# Patient Record
Sex: Female | Born: 1975 | Race: White | Hispanic: Yes | Marital: Single | Smoking: Never smoker
Health system: Southern US, Community
[De-identification: ages and names within clinical notes are randomized; demographics above are authoritative.]

## PROBLEM LIST (undated history)

## (undated) DIAGNOSIS — M545 Low back pain: Secondary | ICD-10-CM

## (undated) DIAGNOSIS — D649 Anemia, unspecified: Secondary | ICD-10-CM

## (undated) DIAGNOSIS — G8929 Other chronic pain: Secondary | ICD-10-CM

## (undated) DIAGNOSIS — N920 Excessive and frequent menstruation with regular cycle: Secondary | ICD-10-CM

## (undated) HISTORY — DX: Excessive and frequent menstruation with regular cycle: N92.0

## (undated) HISTORY — DX: Other chronic pain: G89.29

## (undated) HISTORY — DX: Anemia, unspecified: D64.9

## (undated) HISTORY — DX: Low back pain: M54.5

---

## 2000-01-20 ENCOUNTER — Ambulatory Visit (HOSPITAL_COMMUNITY): Admission: RE | Admit: 2000-01-20 | Discharge: 2000-01-20 | Payer: Self-pay | Admitting: *Deleted

## 2000-02-03 ENCOUNTER — Encounter: Admission: RE | Admit: 2000-02-03 | Discharge: 2000-02-03 | Payer: Self-pay | Admitting: Obstetrics

## 2000-02-16 ENCOUNTER — Encounter: Admission: RE | Admit: 2000-02-16 | Discharge: 2000-02-16 | Payer: Self-pay | Admitting: Obstetrics

## 2000-03-08 ENCOUNTER — Encounter: Admission: RE | Admit: 2000-03-08 | Discharge: 2000-03-08 | Payer: Self-pay | Admitting: Obstetrics & Gynecology

## 2000-03-13 ENCOUNTER — Ambulatory Visit (HOSPITAL_COMMUNITY): Admission: RE | Admit: 2000-03-13 | Discharge: 2000-03-13 | Payer: Self-pay | Admitting: Obstetrics

## 2000-03-22 ENCOUNTER — Encounter: Admission: RE | Admit: 2000-03-22 | Discharge: 2000-03-22 | Payer: Self-pay | Admitting: Obstetrics & Gynecology

## 2000-04-05 ENCOUNTER — Encounter: Admission: RE | Admit: 2000-04-05 | Discharge: 2000-04-05 | Payer: Self-pay | Admitting: Obstetrics & Gynecology

## 2000-04-10 ENCOUNTER — Ambulatory Visit (HOSPITAL_COMMUNITY): Admission: RE | Admit: 2000-04-10 | Discharge: 2000-04-10 | Payer: Self-pay | Admitting: Obstetrics

## 2000-04-12 ENCOUNTER — Encounter: Admission: RE | Admit: 2000-04-12 | Discharge: 2000-04-12 | Payer: Self-pay | Admitting: Obstetrics & Gynecology

## 2000-04-13 ENCOUNTER — Encounter: Admission: RE | Admit: 2000-04-13 | Discharge: 2000-04-13 | Payer: Self-pay | Admitting: Obstetrics

## 2000-04-19 ENCOUNTER — Encounter: Admission: RE | Admit: 2000-04-19 | Discharge: 2000-04-19 | Payer: Self-pay | Admitting: Obstetrics & Gynecology

## 2000-04-26 ENCOUNTER — Encounter: Admission: RE | Admit: 2000-04-26 | Discharge: 2000-04-26 | Payer: Self-pay | Admitting: Obstetrics & Gynecology

## 2000-05-03 ENCOUNTER — Encounter: Admission: RE | Admit: 2000-05-03 | Discharge: 2000-05-03 | Payer: Self-pay | Admitting: Obstetrics & Gynecology

## 2000-05-08 ENCOUNTER — Ambulatory Visit (HOSPITAL_COMMUNITY): Admission: RE | Admit: 2000-05-08 | Discharge: 2000-05-08 | Payer: Self-pay | Admitting: Obstetrics & Gynecology

## 2000-05-10 ENCOUNTER — Encounter: Admission: RE | Admit: 2000-05-10 | Discharge: 2000-05-10 | Payer: Self-pay | Admitting: Obstetrics & Gynecology

## 2000-05-17 ENCOUNTER — Encounter: Admission: RE | Admit: 2000-05-17 | Discharge: 2000-05-17 | Payer: Self-pay | Admitting: Obstetrics & Gynecology

## 2000-05-24 ENCOUNTER — Encounter: Admission: RE | Admit: 2000-05-24 | Discharge: 2000-05-24 | Payer: Self-pay | Admitting: Obstetrics & Gynecology

## 2000-05-31 ENCOUNTER — Encounter: Admission: RE | Admit: 2000-05-31 | Discharge: 2000-05-31 | Payer: Self-pay | Admitting: Obstetrics & Gynecology

## 2000-06-05 ENCOUNTER — Encounter (HOSPITAL_COMMUNITY): Admission: RE | Admit: 2000-06-05 | Discharge: 2000-07-11 | Payer: Self-pay | Admitting: Obstetrics & Gynecology

## 2000-06-07 ENCOUNTER — Encounter: Admission: RE | Admit: 2000-06-07 | Discharge: 2000-06-07 | Payer: Self-pay | Admitting: Obstetrics & Gynecology

## 2000-06-14 ENCOUNTER — Encounter: Admission: RE | Admit: 2000-06-14 | Discharge: 2000-06-14 | Payer: Self-pay | Admitting: Obstetrics & Gynecology

## 2000-06-21 ENCOUNTER — Inpatient Hospital Stay (HOSPITAL_COMMUNITY): Admission: AD | Admit: 2000-06-21 | Discharge: 2000-06-24 | Payer: Self-pay | Admitting: Obstetrics

## 2000-06-21 ENCOUNTER — Encounter (INDEPENDENT_AMBULATORY_CARE_PROVIDER_SITE_OTHER): Payer: Self-pay

## 2004-10-29 ENCOUNTER — Ambulatory Visit: Payer: Self-pay | Admitting: Family Medicine

## 2007-12-07 ENCOUNTER — Telehealth (INDEPENDENT_AMBULATORY_CARE_PROVIDER_SITE_OTHER): Payer: Self-pay | Admitting: Family Medicine

## 2007-12-14 ENCOUNTER — Ambulatory Visit: Payer: Self-pay | Admitting: Internal Medicine

## 2007-12-14 DIAGNOSIS — J069 Acute upper respiratory infection, unspecified: Secondary | ICD-10-CM | POA: Insufficient documentation

## 2007-12-26 ENCOUNTER — Ambulatory Visit: Payer: Self-pay | Admitting: Family Medicine

## 2007-12-26 DIAGNOSIS — J309 Allergic rhinitis, unspecified: Secondary | ICD-10-CM | POA: Insufficient documentation

## 2008-03-13 ENCOUNTER — Ambulatory Visit: Payer: Self-pay | Admitting: Obstetrics & Gynecology

## 2008-03-18 ENCOUNTER — Ambulatory Visit (HOSPITAL_COMMUNITY): Admission: RE | Admit: 2008-03-18 | Discharge: 2008-03-18 | Payer: Self-pay | Admitting: Family Medicine

## 2008-04-02 ENCOUNTER — Ambulatory Visit: Payer: Self-pay | Admitting: Obstetrics & Gynecology

## 2008-08-28 ENCOUNTER — Emergency Department (HOSPITAL_COMMUNITY): Admission: EM | Admit: 2008-08-28 | Discharge: 2008-08-29 | Payer: Self-pay | Admitting: Emergency Medicine

## 2009-10-06 ENCOUNTER — Ambulatory Visit: Payer: Self-pay | Admitting: Internal Medicine

## 2009-11-17 ENCOUNTER — Telehealth (INDEPENDENT_AMBULATORY_CARE_PROVIDER_SITE_OTHER): Payer: Self-pay | Admitting: Nurse Practitioner

## 2009-12-03 ENCOUNTER — Encounter (INDEPENDENT_AMBULATORY_CARE_PROVIDER_SITE_OTHER): Payer: Self-pay | Admitting: Internal Medicine

## 2010-02-11 ENCOUNTER — Ambulatory Visit: Payer: Self-pay | Admitting: Internal Medicine

## 2010-02-11 DIAGNOSIS — E049 Nontoxic goiter, unspecified: Secondary | ICD-10-CM | POA: Insufficient documentation

## 2010-02-11 DIAGNOSIS — E663 Overweight: Secondary | ICD-10-CM | POA: Insufficient documentation

## 2010-02-11 DIAGNOSIS — H547 Unspecified visual loss: Secondary | ICD-10-CM | POA: Insufficient documentation

## 2010-02-11 DIAGNOSIS — R81 Glycosuria: Secondary | ICD-10-CM | POA: Insufficient documentation

## 2010-02-11 LAB — CONVERTED CEMR LAB
ALT: 15 units/L (ref 0–35)
AST: 20 units/L (ref 0–37)
Albumin: 4.1 g/dL (ref 3.5–5.2)
BUN: 13 mg/dL (ref 6–23)
Basophils Absolute: 0 10*3/uL (ref 0.0–0.1)
Basophils Relative: 0 % (ref 0–1)
CO2: 24 meq/L (ref 19–32)
Calcium: 8.9 mg/dL (ref 8.4–10.5)
Chlamydia, DNA Probe: NEGATIVE
Chloride: 104 meq/L (ref 96–112)
Cholesterol: 181 mg/dL (ref 0–200)
GC Probe Amp, Genital: NEGATIVE
Hemoglobin: 12.1 g/dL (ref 12.0–15.0)
Lymphocytes Relative: 38 % (ref 12–46)
MCHC: 31.5 g/dL (ref 30.0–36.0)
Monocytes Absolute: 0.7 10*3/uL (ref 0.1–1.0)
Monocytes Relative: 10 % (ref 3–12)
Neutro Abs: 3.4 10*3/uL (ref 1.7–7.7)
Neutrophils Relative %: 50 % (ref 43–77)
Potassium: 4.1 meq/L (ref 3.5–5.3)
RBC: 4.31 M/uL (ref 3.87–5.11)
TSH: 1.412 microintl units/mL (ref 0.350–4.500)

## 2010-02-15 ENCOUNTER — Encounter: Admission: RE | Admit: 2010-02-15 | Discharge: 2010-02-15 | Payer: Self-pay | Admitting: Internal Medicine

## 2010-02-25 ENCOUNTER — Encounter (INDEPENDENT_AMBULATORY_CARE_PROVIDER_SITE_OTHER): Payer: Self-pay | Admitting: Internal Medicine

## 2010-03-22 ENCOUNTER — Encounter (INDEPENDENT_AMBULATORY_CARE_PROVIDER_SITE_OTHER): Payer: Self-pay | Admitting: Internal Medicine

## 2010-04-06 ENCOUNTER — Encounter (INDEPENDENT_AMBULATORY_CARE_PROVIDER_SITE_OTHER): Payer: Self-pay | Admitting: Internal Medicine

## 2010-08-31 NOTE — Letter (Signed)
Summary: Lipid Letter  [facility]  [address] [place], [place] [address]   Phone: [phone]  Fax: [phone]    02/25/2010  Nicky Pugh [address] [place], [place]  [address]  Dear Byrd Hesselbach:  We have carefully reviewed your last lipid profile from 02/11/2010 and the results are noted below with a summary of recommendations for lipid management.    Cholesterol:       181     Goal: <200   HDL "good" Cholesterol:   59     Goal: >45   LDL "bad" Cholesterol:   112     Goal: <130   Triglycerides:       51     Goal: <150    Cholesterol is okay.  Labs other than sugar in urine were fine--includes normal thyroid testing and pap    TLC Diet (Therapeutic Lifestyle Change): Saturated Fats & Transfatty acids should be kept < 7% of total calories ***Reduce Saturated Fats Polyunstaurated Fat can be up to 10% of total calories Monounsaturated Fat Fat can be up to 20% of total calories Total Fat should be no greater than 25-35% of total calories Carbohydrates should be 50-60% of total calories Protein should be approximately 15% of total calories Fiber should be at least 20-30 grams a day ***Increased fiber may help lower LDL Total Cholesterol should be < 200mg /day Consider adding plant stanol/sterols to diet (example: Benacol spread) ***A higher intake of unsaturated fat may reduce Triglycerides and Increase HDL    Adjunctive Measures (may lower LIPIDS and reduce risk of Heart Attack) include: Aerobic Exercise (20-30 minutes 3-4 times a week) Limit Alcohol Consumption Weight Reduction Aspirin 75-81 mg a day by mouth (if not allergic or contraindicated) Dietary Fiber 20-30 grams a day by mouth     Current Medications: 1)    Xyzal 5 Mg Tabs (Levocetirizine dihydrochloride) .Marland Kitchen.. 1 tab by mouth daily as needed allergies 2)    Nasacort Aq 55 Mcg/act  Aers (Triamcinolone acetonide(nasal)) .... 2 sprays each nostril daily 3)    Singulair 10 Mg Tabs (Montelukast  sodium) .... One tablet by mouth nightly for alleriges  If you have any questions, please call. We appreciate being able to work with you.   Sincerely,    [facility] Julieanne Manson MD

## 2010-08-31 NOTE — Letter (Signed)
Summary: NUTRITION & DIABETES//DID NOT SHOW  NUTRITION & DIABETES//DID NOT SHOW   Imported By: Arta Bruce 04/22/2010 10:23:10  _____________________________________________________________________  External Attachment:    Type:   Image     Comment:   External Document

## 2010-08-31 NOTE — Progress Notes (Signed)
Summary: Office Visit//DEPRESSION SCREENING  Office Visit//DEPRESSION SCREENING   Imported By: Arta Bruce 02/12/2010 14:58:11  _____________________________________________________________________  External Attachment:    Type:   Image     Comment:   External Document

## 2010-08-31 NOTE — Letter (Signed)
Summary: REQUESTING RECORDS FROM DR.TIMOTHY   REQUESTING RECORDS FROM DR.TIMOTHY JAMES   Imported By: Arta Bruce 03/22/2010 11:34:49  _____________________________________________________________________  External Attachment:    Type:   Image     Comment:   External Document

## 2010-08-31 NOTE — Assessment & Plan Note (Signed)
Summary: CPP EXAM///GK   Vital Signs:  Patient profile:   35 year old female LMP:     01/26/2010 Weight:      140 pounds Temp:     98.6 degrees F Pulse rate:   68 / minute Pulse rhythm:   regular Resp:     18 per minute BP sitting:   103 / 60  (left arm) Cuff size:   regular  Vitals Entered By: Vesta Mixer CMA (February 11, 2010 9:00 AM) CC: CPP Is Patient Diabetic? No Pain Assessment Patient in pain? no       Does patient need assistance? Ambulation Normal LMP (date): 01/26/2010     Enter LMP: 01/26/2010   CC:  CPP.  History of Present Illness: 35 yo female here for CPP  Concerns:  1.  Weight.  Eats 2 times daily.   Eats a lot before bedtime.    2. Left vision a problem:  Feels like she is looking through a telescope.  Has been a problem for 2 months.  Pt. went to optometry at [facility].  Was told perhaps she hit her head and the problem should last for 4-6 weeks and should go away.  Sometimes can occur with her age.  Pt. to go back to optometry in coming weeks--appt. needed to be rescheduled.  Pt. denies any history of head injury.  Did get an Rx for glasses. Pt. cannot recall the diagnosis.    Current Medications (verified): 1)  Xyzal 5 Mg Tabs (Levocetirizine Dihydrochloride) .Marland Kitchen.. 1 Tab By Mouth Daily As Needed Allergies 2)  Nasacort Aq 55 Mcg/act  Aers (Triamcinolone Acetonide(Nasal)) .... 2 Sprays Each Nostril Daily 3)  Singulair 10 Mg Tabs (Montelukast Sodium) .... One Tablet By Mouth Nightly For Alleriges  Allergies (verified): 1)  ! Penicillin  Past History:  Past Medical History: VISUAL ACUITY, DECREASED, LEFT EYE (ICD-369.9) OVERWEIGHT (ICD-278.02) ALLERGIC RHINITIS (ICD-477.9) VIRAL URI (ICD-465.9)  Past Surgical History: None  Family History: Mother, 29:  Hypertension, hx of alcoholism, cirrhosis Father, died age 33:  MI, DM, Kidney failure, hypertension, anemia 8 Siblings:  5 Sisters and 3 Brothers:  Several sisters with weight concerns,  one sister with hypertension 3 children:  73 yo son:  healthy                      46 yo twins, daughters:  healthy  Social History: Lives at home with sister, 3 children Never married--no signficant other currently Works in a Environmental health practitioner. Originally from [place].  [place] Tobacco:  no history of smoking Alcohol:  Rare Drug:  never  Review of Systems General:  Energy is fine. Eyes:  See HPI--wears glasses. ENT:  Denies decreased hearing. CV:  Denies chest pain or discomfort and palpitations. Resp:  Denies shortness of breath. GI:  Denies abdominal pain, bloody stools, constipation, dark tarry stools, and diarrhea. GU:  Denies discharge and dysuria. MS:  Denies joint pain, joint redness, and joint swelling. Derm:  Denies lesion(s) and rash. Neuro:  Complains of brief paralysis; denies numbness, tingling, and weakness. Psych:  Denies anxiety and depression; PHQ 9 scored a 0.  Physical Exam  General:  Overweight, Well-developed,well-nourished,in no acute distress; alert,appropriate and cooperative throughout examination Head:  Normocephalic and atraumatic without obvious abnormalities. No apparent alopecia or balding. Eyes:  No corneal or conjunctival inflammation noted. EOMI. Perrla. Funduscopic exam benign, without hemorrhages, exudates or papilledema. Vision grossly normal.  Unable to see defininitve abnormality on  left disc exam, but difficulties bringing into good focuse Ears:  External ear exam shows no significant lesions or deformities.  Otoscopic examination reveals clear canals, tympanic membranes are intact bilaterally without bulging, retraction, inflammation or discharge. Hearing is grossly normal bilaterally. Nose:  External nasal examination shows no deformity or inflammation. Nasal mucosa are pink and moist without lesions or exudates. Mouth:  Oral mucosa and oropharynx without lesions or exudates.  Teeth in good repair. Neck:  No deformities, masses, or  tenderness noted.  Mild thryoid enlargement--feels diffuse Breasts:  Thickening of superior medial quadrants of bilateral breasts--nontender.  No other focal mass, skin dimpling or nipple discharge Lungs:  Normal respiratory effort, chest expands symmetrically. Lungs are clear to auscultation, no crackles or wheezes. Heart:  Normal rate and regular rhythm. S1 and S2 normal without gallop, murmur, click, rub or other extra sounds. Abdomen:  Bowel sounds positive,abdomen soft and non-tender without masses, organomegaly or hernias noted. Genitalia:  Pelvic Exam:        External: normal female genitalia without lesions or masses        Vagina: normal without lesions or masses        Cervix: normal without lesions or masses        Adnexa: normal bimanual exam without masses or fullness        Uterus: normal by palpation        Pap smear: performed Msk:  No deformity or scoliosis noted of thoracic or lumbar spine.   Pulses:  R and L carotid,radial,femoral,dorsalis pedis and posterior tibial pulses are full and equal bilaterally Extremities:  No clubbing, cyanosis, edema, or deformity noted with normal full range of motion of all joints.   Neurologic:  No cranial nerve deficits noted. Station and gait are normal. Plantar reflexes are down-going bilaterally. DTRs are symmetrical throughout. Sensory, motor and coordinative functions appear intact. Skin:  Well demarcated, nonpalpable dark brown nevus on left lateral ankle--pt. states no change from when a child Cervical Nodes:  No lymphadenopathy noted Axillary Nodes:  No palpable lymphadenopathy Inguinal Nodes:  No significant adenopathy Psych:  Cognition and judgment appear intact. Alert and cooperative with normal attention span and concentration. No apparent delusions, illusions, hallucinations   Impression & Recommendations:  Problem # 1:  ROUTINE GYNECOLOGICAL EXAMINATION (ICD-V72.31)  Orders: T- GC Chlamydia ([address]) T-HIV Antibody  (Reflex)  ([id number]) T-Pap Smear, Thin Prep (91478) T-Syphilis Test (RPR) [phone]) KOH/ WET Mount [phone]) Pap Smear, Thin Prep ( Collection of) (N6295)  Problem # 2:  BILATERAL MEDIAL BREAST THICKENING (ICD-611.79)  Orders: Mammogram (Diagnostic) (Mammo)--breast ultrasound  Problem # 3:  VISUAL ACUITY, DECREASED, LEFT EYE (ICD-369.9) Send for records--sounds like something felt will resolve per pt.  Problem # 4:  GLYCOSURIA (ICD-791.5) Pt. give history of this since a child --bloodwork reportedly normal in past Orders: T-Comprehensive Metabolic Panel ([id number]) UA Dipstick w/o Micro (manual) (02725)  Problem # 5:  OVERWEIGHT (ICD-278.02) Discussed healthy diet Orders: Nutrition Referral (Nutrition)  Problem # 6:  THYROMEGALY (ICD-240.9)  Orders: T-TSH (36644-03474)  Complete Medication List: 1)  Xyzal 5 Mg Tabs (Levocetirizine dihydrochloride) .Marland Kitchen.. 1 tab by mouth daily as needed allergies 2)  Nasacort Aq 55 Mcg/act Aers (Triamcinolone acetonide(nasal)) .... 2 sprays each nostril daily 3)  Singulair 10 Mg Tabs (Montelukast sodium) .... One tablet by mouth nightly for alleriges  Other Orders: T-Lipid Profile ([address]956-38756) T-CBC w/Diff [phone])  Patient Instructions: 1)  Release of information from [facility] Fax [phone], phone  [phone], ext  [phone] Prescriptions: SINGULAIR 10 MG TABS (MONTELUKAST SODIUM) One tablet by mouth nightly for alleriges  #30 x 11   Entered and Authorized by:   Julieanne Manson MD   Signed by:   Julieanne Manson MD on 02/11/2010   Method used:   Faxed to ...       [facility] (retail)       [address] [place], [place]  [address]       Ph: [phone]       Fax: [phone]   RxID:   [id number] NASACORT AQ 55 MCG/ACT  AERS (TRIAMCINOLONE ACETONIDE(NASAL)) 2 sprays each nostril daily  #1 month x 11   Entered and Authorized by:   Julieanne Manson MD    Signed by:   Julieanne Manson MD on 02/11/2010   Method used:   Faxed to ...       [facility] (retail)       [address] [place], [place]  [address]       Ph: [phone]       Fax: [phone]   RxID:   [id number] XYZAL 5 MG TABS (LEVOCETIRIZINE DIHYDROCHLORIDE) 1 tab by mouth daily as needed allergies  #30 x 11   Entered and Authorized by:   Julieanne Manson MD   Signed by:   Julieanne Manson MD on 02/11/2010   Method used:   Faxed to ...       [facility] (retail)       [address] [place], [place]  [address]       Ph: [phone]       Fax: [phone]   RxID:   [id number]    Preventive Care Screening     Last Pap:  Sometime last year--Family Planning.  Always normal Mammogram:  never SBE:  once monthly--no changes Colonoscopy;  nver Osteoprevention:  1 serving of milk daily, occasional yogurt and cheese.  Walks a lot with work--laundry. Immunizations:    Tetanus/Td Immunization History:    Tetanus/Td # 1:  Historical (08/01/2001)   Appended Document: CPP EXAM///GK    Clinical Lists Changes  Observations: Added new observation of HIVRAPIDRSLT: negative (02/11/2010 10:07) Added new observation of PH URINE: 5.0  (02/11/2010 10:07) Added new observation of SPEC GR URIN: >=1.030  (02/11/2010 10:07) Added new observation of WBC DIPSTK U: negative  (02/11/2010 10:07) Added new observation of NITRITE URN: negative  (02/11/2010 10:07) Added new observation of UROBILINOGEN: negative  (02/11/2010 10:07) Added new observation of PROTEIN, URN: negative  (02/11/2010 10:07) Added new observation of BLOOD UR DIP: negative  (02/11/2010 10:07) Added new observation of KETONES URN: negative  (02/11/2010 10:07) Added new observation of BILIRUBIN UR: negative  (02/11/2010 10:07) Added new observation of GLUCOSE, URN: >=1000  (02/11/2010 10:07) Added new  observation of WHIFF TEST: Negative whiff  (02/11/2010 10:07) Added new observation of KOH PREP: Negative  (02/11/2010 10:07) Added new observation of INSTRUCTIONS: Release of info for eye doctor Call for repeat pap/physical in 1 year Get your prescriptions at [facility]  (02/11/2010 10:07)         Patient Instructions: 1)  Release of info for eye doctor 2)  Call for repeat pap/physical in 1 year 3)  Get your prescriptions at [facility]   Laboratory Results   Urine Tests    Routine Urinalysis  Glucose: >=1000   (Normal Range: Negative) Bilirubin: negative   (Normal Range: Negative) Ketone: negative   (Normal Range: Negative) Spec. Gravity: >=1.030   (Normal Range: 1.003-1.035) Blood: negative   (Normal Range: Negative) pH: 5.0   (Normal Range: 5.0-8.0) Protein: negative   (Normal Range: Negative) Urobilinogen: negative   (Normal Range: 0-1) Nitrite: negative   (Normal Range: Negative) Leukocyte Esterace: negative   (Normal Range: Negative)      Wet Mount/KOH Source: vaginal WBC/hpf: 1-5 Bacteria/hpf: 2+ Clue cells/hpf: moderate  Negative whiff Yeast/hpf: none Trichomonas/hpf: none  Other Tests  Rapid HIV: negative   Laboratory Results   Urine Tests    Routine Urinalysis   Glucose: >=1000   (Normal Range: Negative) Bilirubin: negative   (Normal Range: Negative) Ketone: negative   (Normal Range: Negative) Spec. Gravity: >=1.030   (Normal Range: 1.003-1.035) Blood: negative   (Normal Range: Negative) pH: 5.0   (Normal Range: 5.0-8.0) Protein: negative   (Normal Range: Negative) Urobilinogen: negative   (Normal Range: 0-1) Nitrite: negative   (Normal Range: Negative) Leukocyte Esterace: negative   (Normal Range: Negative)      Wet Mount  Negative whiff Wet Mount KOH: Negative  Other Tests  Rapid HIV: negative

## 2010-08-31 NOTE — Assessment & Plan Note (Signed)
Summary: renew medications//gk   Vital Signs:  Patient profile:   35 year old female Height:      61 inches Weight:      138.1 pounds BMI:     26.19 Temp:     98.3 degrees F Pulse rate:   67 / minute Pulse rhythm:   regular Resp:     16 per minute BP sitting:   109 / 74  (left arm) Cuff size:   regular  Vitals Entered By: Vesta Mixer CMA (October 06, 2009 8:53 AM) CC: Needs her allergy meds refilled.  Has also been having a dry cough for about 2 weeks has tried otc meds, but nothing helps, worse a night. Is Patient Diabetic? No Pain Assessment Patient in pain? no       Does patient need assistance? Ambulation Normal   CC:  Needs her allergy meds refilled.  Has also been having a dry cough for about 2 weeks has tried otc meds, but nothing helps, and worse a night..  History of Present Illness: 1.  Allergic Rhinitis:  Claritin did not help allergies.  Allegra works better and does not make her sleepy.  Using Nasocort and that helps.  Not necessarily using regularly, however.  Has nasal, eye and ear itching, sore throat, sneezing.  Sometimes with watery eye discharge.  Definite nasal congestion.  Symptoms most prominent in the spring.  Has never tried Xyzal.  Our pharmacy will no longer carry Allegra as has gone otc.    Allergies (verified): 1)  ! Penicillin  Physical Exam  General:  NAD Head:  NT over sinuses Eyes:  No injection of conjunctivae currently Ears:  External ear exam shows no significant lesions or deformities.  Otoscopic examination reveals clear canals, tympanic membranes are intact bilaterally without bulging, retraction, inflammation or discharge. Hearing is grossly normal bilaterally. Nose:  Right nostril with erythema and swelling.  Clear discharge Mouth:  pharynx pink and moist.  Cobbling of posterio pharynx Neck:  No deformities, masses, or tenderness noted. Lungs:  Normal respiratory effort, chest expands symmetrically. Lungs are clear to auscultation,  no crackles or wheezes. Heart:  Normal rate and regular rhythm. S1 and S2 normal without gallop, murmur, click, rub or other extra sounds.   Impression & Recommendations:  Problem # 1:  ALLERGIC RHINITIS (ICD-477.9)  Her updated medication list for this problem includes:    Xyzal 5 Mg Tabs (Levocetirizine dihydrochloride) .Marland Kitchen... 1 tab by mouth daily as needed allergies    Nasacort Aq 55 Mcg/act Aers (Triamcinolone acetonide(nasal)) .Marland Kitchen... 2 sprays each nostril daily  Complete Medication List: 1)  Xyzal 5 Mg Tabs (Levocetirizine dihydrochloride) .Marland Kitchen.. 1 tab by mouth daily as needed allergies 2)  Nasacort Aq 55 Mcg/act Aers (Triamcinolone acetonide(nasal)) .... 2 sprays each nostril daily  Patient Instructions: 1)  CPP with Dr. Delrae Alfred in 4 months 2)  Call if allergies not controlled with 2 meds Prescriptions: NASACORT AQ 55 MCG/ACT  AERS (TRIAMCINOLONE ACETONIDE(NASAL)) 2 sprays each nostril daily  #1 month x 11   Entered and Authorized by:   Julieanne Manson MD   Signed by:   Julieanne Manson MD on 10/06/2009   Method used:   Faxed to ...       [facility] (retail)       [address] [place], [place]  [address]       Ph: [phone]       Fax: [phone]  RxID:   [id number] XYZAL 5 MG TABS (LEVOCETIRIZINE DIHYDROCHLORIDE) 1 tab by mouth daily as needed allergies  #30 x 11   Entered and Authorized by:   Julieanne Manson MD   Signed by:   Julieanne Manson MD on 10/06/2009   Method used:   Faxed to ...       [facility] (retail)       [address] [place], [place]  [address]       Ph: [phone]       Fax: [phone]   RxID:   [id number]

## 2010-08-31 NOTE — Progress Notes (Signed)
Summary: ALLERGY MEDS ARE NOT WORKING  Phone Note Call from Patient Call back at [facility] Phone [phone]   Summary of Call: MULBERRY PT. Cynthia Rodriguez CALLED TO LET YOU KNOW THAT THE NASAL SPRAY AND THE XYZAL IS NOT WORKING. HER EYES ARE STILL ITCHY AND IS STILL CONGESTED. SHE SAYS THAT SHE WAS TOLD THAT IF IT DOESN'T WORK TO CALL HERE TO THE OFFICE AND LET YOU KNOW AND SHE COULD PROBABLY TRY USING THE SINGULAIR.  SHE USES [facility] ON [address]. Initial call taken by: Leodis Rains,  November 17, 2009 10:30 AM  Follow-up for Phone Call        forward to N. Daphine Deutscher, FNP Follow-up by: Levon Hedger,  November 17, 2009 12:54 PM  Additional Follow-up for Phone Call Additional follow up Details #1::        all the medications REDUCE symptoms not CURE - she needs to avoid being outside on high pollen count days, fresh cut grass and other known triggers such as pets let pt know singulair will be expensive at [facility] but she can get from there if she so desires Rx in basket - she can get from [facility] pharmacy at a discounted price she should still continue to use the xyzal and nasal spray and add on the singulair at night. Additional Follow-up by: Lehman Prom FNP,  November 17, 2009 1:07 PM    Additional Follow-up for Phone Call Additional follow up Details #2::    per Ashby Dawes pt informed. Rx faxed to [facility] Follow-up by: Levon Hedger,  November 23, 2009 4:08 PM  New/Updated Medications: SINGULAIR 10 MG TABS (MONTELUKAST SODIUM) One tablet by mouth nightly for alleriges Prescriptions: SINGULAIR 10 MG TABS (MONTELUKAST SODIUM) One tablet by mouth nightly for alleriges  #30 x 5   Entered and Authorized by:   Lehman Prom FNP   Signed by:   Lehman Prom FNP on 11/17/2009   Method used:   Printed then faxed to ...       [facility] [address] [id number] (retail)       [address] [place], [place]  [address]       Ph: [phone]       Fax: [phone]   RxID:    [id number]

## 2010-08-31 NOTE — Letter (Signed)
Summary: PERSONNAL VISION SUMMARY  PERSONNAL VISION SUMMARY   Imported By: Arta Bruce 07/02/2010 12:43:49  _____________________________________________________________________  External Attachment:    Type:   Image     Comment:   External Document

## 2010-09-29 ENCOUNTER — Encounter (INDEPENDENT_AMBULATORY_CARE_PROVIDER_SITE_OTHER): Payer: Self-pay | Admitting: Internal Medicine

## 2010-10-07 NOTE — Letter (Signed)
Summary: PERSONAL VISION SUMARY  PERSONAL VISION SUMARY   Imported By: Arta Bruce 09/30/2010 15:16:20  _____________________________________________________________________  External Attachment:    Type:   Image     Comment:   External Document

## 2010-10-07 NOTE — Miscellaneous (Signed)
Summary: Eye Provider, records review  Clinical Lists Changes  Problems: Changed problem from VISUAL ACUITY, DECREASED, LEFT EYE (ICD-369.9) to VISUAL ACUITY, DECREASED, LEFT EYE (ICD-369.9) - 12/03/09:  Seen by unknown eye provider--cannot make out faxed report--I believe she has a  left vitreous separation?  based on her assessment, but report difficult to read.

## 2010-10-07 NOTE — Miscellaneous (Signed)
  Clinical Lists Changes  Problems: Changed problem from VISUAL ACUITY, DECREASED, LEFT EYE (ICD-369.9) - 12/03/09:  Seen by unknown eye provider--cannot make out faxed report--I believe she has a  left vitreous separation?  based on her assessment, but report difficult to read.   to VISUAL ACUITY, DECREASED, LEFT EYE (ICD-369.9) - 12/03/09:  Seen by I believe, Tommie Raymond, O.D.--cannot make out faxed report--I believe she has a  left vitreous separation?  based on her assessment, but report difficult to read.

## 2010-12-14 NOTE — Group Therapy Note (Signed)
Cynthia Rodriguez, Cynthia Rodriguez NO.:  [id number]   MEDICAL RECORD NO.:  [id number]          PATIENT TYPE:  WOC   LOCATION:  [facility]                   FACILITY:  [facility]   PHYSICIAN:  Elsie Lincoln, MD      DATE OF BIRTH:  Jun 10, 1976   DATE OF SERVICE:  04/02/2008                                  CLINIC NOTE   The patient is a 35 year old female who presents for results for her  ultrasound.  The patient was felt to have an enlarged uterus.  However,  on ultrasound, it shows it is measured a normal size, however, the high  end of normal, 12 x 5.1 x 7.4 cm.  The IUD is located in the proper  place.  Both ovaries are normal.  I did talk to the patient about her  bowel habits and sometimes she has a bowel movement once a day,  sometimes every 3 days, and it is hard at times.  The pain she has she  has not noticed if it is associated with constipation, however, I told  her to pay attention to this and this could help.  She also may have  irritable bowel syndrome and would need to see her primary care doctor  for that.  I recommended docusate sodium twice a day and if that does  not work, then [employer] according to the bottle directions.  The patient  is to follow up at [facility] for Pap smears and any  other issue she may have.           ______________________________  Elsie Lincoln, MD     KL/MEDQ  D:  04/02/2008  T:  04/03/2008  Job:  [id number]

## 2010-12-17 NOTE — Discharge Summary (Signed)
[facility]  Patient:    Cynthia Rodriguez, Cynthia Rodriguez                       MRN: [id number] Adm. Date:  06/21/00 Disc. Date: 06/24/00 Attending:  Bing Neighbors. Clearance Coots, M.D. Dictator:   Cathi Roan                           Discharge Summary  DISCHARGE DIAGNOSES:          Status post cesarean section for twin gestation.  HISTORY OF PRESENT ILLNESS:   Please see dictated history and physical for details.  Basically, 35 year old Hispanic female presented to maternity admissions with regular uterine contractions, found to have a presenting part breech.  Patient known to have had a twin gestation.  Felt medically the best decision between the patient and Dr. Clearance Coots to proceed.  HOSPITAL COURSE:              When patient presented with a breech presentation discussed with patient and with attending physician.  Felt best decision to be primary lateral transverse cesarean section.  Surgeon:  Dr. Coral Ceo.  Assistant:  Dr. Antionette Char and Dr. Jamey Reas.  Baby A weighing 6 pounds 15 ounces.  Baby B 6 pounds 11 ounces. Patient proceeded to have a routine postpartum course after her cesarean section.  Did work with a Advertising copywriter.  Staples were removed and she was discharged after establishing social work, patient eligibility for [facility].  DISCHARGE MEDICATIONS:        Motrin, Percocet, prenatal vitamins, ferrous sulfate, Colace.  LABORATORIES:                 Hemoglobin and hematocrit 9.9 and 28.2 at discharge.  ACTIVITY:                     Increase as tolerated.  DISCHARGE INSTRUCTIONS:       Routine postpartum instructions.  DISPOSITION:                  Patient was discharged in well condition. DD:  02/25/01 TD:  02/25/01 Job: [id number]

## 2010-12-17 NOTE — Op Note (Signed)
[facility]  Patient:    Cynthia Rodriguez, Cynthia Rodriguez                       MRN: [id number] Proc. Date: 06/21/00 Adm. Date:  [id number] Attending:  Tammi Sou                           Operative Report  DATE OF BIRTH:                01-Mar-1976  PREOPERATIVE DIAGNOSIS:       Term twin intrauterine gestation with                               twin A presenting twin in breech position.  POSTOPERATIVE DIAGNOSIS:      Term twin intrauterine gestation with                               twin A presenting twin in breech position.  OPERATION:                    Primary low transverse cesarean section via                               a Pfannenstiel incision.  SURGEON:                      Charles A. Clearance Coots, M.D.  ASSISTANT:                    Roseanna Rainbow, M.D. and Jamey Reas, M.D.  FINDINGS:                     Viable female infant twin A in frank breech position weighing 6 pounds 15 ounces, also viable female twin B weighing 6 pounds 11 ounces, in vertex position.  Normal uterus, tubes, and ovaries.  ESTIMATED BLOOD LOSS:         800 cc.  IV FLUIDS:                    3800 cc of lactated Ringers.  URINE OUTPUT:                 700 cc clear at the end of the procedure.  ANESTHESIA:                   Spinal.  SPECIMENS:                    None.  DESCRIPTION OF PROCEDURE:     The patient was taken to the operating room where a spinal anesthesia was introduced and found to be adequate.  She was then prepped and draped in the normal sterile fashion in the dorsal supine position, with the leftward tilt.  A Pfannenstiel skin incision was then made with the scalpel and carried through to the underlying layer of fascia.  The fascia was reincised in the midline and the incision extended laterally with Mayo scissors.  The superior aspect  of the fascial incision was then grasped with Kocher clamps, elevated,  and the underlying rectus muscles dissected off bluntly.  Attention was then turned to the inferior aspect of this incision, which in a similar fashion, was grasped, tented up with Kocher clamps, and the rectus muscle dissected off bluntly.  The rectus muscles were then separated in the midline and the peritoneum identified, tented up, and entered sharply with Metzenbaum scissors.  The peritoneal incision was then extended superiorly and inferiorly, with good visualization of the bladder.  The bladder blade was then inserted and the vesicouterine peritoneum identified, grasped with pickups, and entered sharply with Metzenbaum scissors.  The incision was then extended laterally, and the bladder flap created digitally. The bladder blade was then reinserted in the lower uterine segment, incised in a transverse fashion with the scalpel.  The uterine incision was then extended laterally with bandage scissors.  The bladder blade was removed and twin A was found to be in frank breech presentation.  The body was delivered atraumatically without complications.  The nose and mouth were suctioned with bulb suction.  The cord clamped and cut.  The infant was handed off to the waiting NICU team.  Attention was then turned to Twin B, who had the artificial rupture of membranes, and was delivered in a silent presentation.  The infant was bulb suctioned, the cord clamped and cut, and the infant was handed to the waiting NICU team.  The cord blood was extracted from both placentas.  The placenta was then removed manually and the uterus was exteriorized, and cleared of all clots and debris.  The uterine incision was repaired with #1-0 Monocryl in a running locked fashion.  Excellent hemostasis was noted.  The uterus was then returned to the abdomen, irrigated thoroughly.  The gutters were cleared of all clots.  The rectus abdominal muscles were reapproximated with two interrupted sutures using the #0  Monocryl suture.  The fascia was reapproximated with #0 PDS in a running fashion.  The skin was closed with staples.  The patient tolerated the procedure well.  The sponge, lap, and needle counts were correct x 2.  Cefotetan 2 g was given at cord clamp.  The patient was taken to the recovery room in stable condition. DD:  06/21/00 TD:  06/22/00 Job: [id number]

## 2010-12-17 NOTE — H&P (Signed)
[facility]  Patient:    Cynthia Rodriguez, Cynthia Rodriguez                       MRN: [id number] Attending:  Michaelle Copas                         History and Physical  HISTORY OF PRESENT ILLNESS:   A 35 year old Hispanic female presented to maternity admissions with regular, strong uterine contractions and on examination was found to have presenting part breech.  The patient was known to have a twin gestation.  She did not speak any English but through the interpreter, she did not desire to deliver the twins vaginally with the first twin presenting breech.  A decision was therefore made to proceed with cesarean section delivery.  PAST MEDICAL HISTORY:         See medical records chart for full historical data.  PHYSICAL EXAMINATION:  GENERAL:                      Well-nourished, well-developed Hispanic female in no acute distress.  VITAL SIGNS:                  Afebrile, vital signs stable.  HEENT:                        Normocephalic, atraumatic.  LUNGS:                        Clear to auscultation bilaterally.  HEART:                        Without murmurs, rubs, or gallops.  Regular rate and rhythm.  ABDOMEN:                      Soft, gravid, nontender.  PELVIC:                       Cervix 2 cm, 100% effaced, the ______ was presenting at a -2 station.  Membranes are intact.  IMPRESSION:                   Term pregnancy, twin gestation, presenting twin breech.  Language barrier for effective communication.  Patient desired cesarean section delivery because of presenting twin being breech, as communicated through the interpreter.  PLAN:                         Cesarean section delivery. DD:  08/11/00 TD:  08/11/00 Job: [id number]

## 2013-07-16 DIAGNOSIS — W230XXA Caught, crushed, jammed, or pinched between moving objects, initial encounter: Secondary | ICD-10-CM | POA: Insufficient documentation

## 2013-07-16 DIAGNOSIS — Y929 Unspecified place or not applicable: Secondary | ICD-10-CM | POA: Insufficient documentation

## 2013-07-16 DIAGNOSIS — S6000XA Contusion of unspecified finger without damage to nail, initial encounter: Secondary | ICD-10-CM | POA: Insufficient documentation

## 2013-07-16 DIAGNOSIS — Y990 Civilian activity done for income or pay: Secondary | ICD-10-CM | POA: Insufficient documentation

## 2013-07-16 DIAGNOSIS — Z880 Allergy status to penicillin: Secondary | ICD-10-CM | POA: Insufficient documentation

## 2013-07-16 NOTE — ED Notes (Signed)
Pt states she smashed her finger between two carts at work tonight

## 2013-07-17 ENCOUNTER — Emergency Department (HOSPITAL_COMMUNITY): Payer: Worker's Compensation

## 2013-07-17 ENCOUNTER — Emergency Department (HOSPITAL_COMMUNITY)
Admission: EM | Admit: 2013-07-17 | Discharge: 2013-07-17 | Disposition: A | Discharge status: Home / Self Care | Payer: Worker's Compensation | Attending: Emergency Medicine | Admitting: Emergency Medicine

## 2013-07-17 ENCOUNTER — Encounter (HOSPITAL_COMMUNITY): Payer: Self-pay | Admitting: Emergency Medicine

## 2013-07-17 DIAGNOSIS — S6000XA Contusion of unspecified finger without damage to nail, initial encounter: Secondary | ICD-10-CM

## 2013-07-17 MED ORDER — IBUPROFEN 400 MG PO TABS
800.0000 mg | ORAL_TABLET | Freq: Once | ORAL | Status: AC
  Administered 2013-07-17: 800 mg via ORAL
  Filled 2013-07-17: qty 2

## 2013-07-17 MED ORDER — IBUPROFEN 600 MG PO TABS: 600.0000 mg | ORAL_TABLET | Freq: Four times a day (QID) | ORAL | Status: DC | PRN

## 2013-07-17 NOTE — ED Provider Notes (Signed)
Medical screening examination/treatment/procedure(s) were performed by non-physician practitioner and as supervising physician I was immediately available for consultation/collaboration.   Colleene Swarthout, MD 07/17/13 0617 

## 2013-07-17 NOTE — ED Provider Notes (Signed)
CSN: [id number]     Arrival date & time 07/16/13  2355 History   First MD Initiated Contact with Patient 07/17/13 0004     Chief Complaint  Patient presents with  . Finger Injury   (Consider location/radiation/quality/duration/timing/severity/associated sxs/prior Treatment) HPI Comments: 37 her old female presents for pain to left index finger after it was smashed between 2 carts at work this evening. Pain is burning and throbbing in nature and intermittently radiates to the distal tip of her left index finger. Pain is worse with movement and palpation to her left PIP joint. Patient denies pallor, redness, numbness, and weakness.  Patient is a 37 y.o. female presenting with hand pain. The history is provided by the patient. No language interpreter was used.  Hand Pain This is a new problem. The current episode started today. The problem occurs constantly. The problem has been unchanged. Associated symptoms include arthralgias, joint swelling and myalgias. Pertinent negatives include no fever, numbness or weakness. The symptoms are aggravated by bending. She has tried nothing for the symptoms. Improvement on treatment: n/a.    History reviewed. No pertinent past medical history. History reviewed. No pertinent past surgical history. No family history on file. History  Substance Use Topics  . Smoking status: Never Smoker   . Smokeless tobacco: Not on file  . Alcohol Use: No   OB History   Grav Para Term Preterm Abortions TAB SAB Ect Mult Living   2 2 2       2      Review of Systems  Constitutional: Negative for fever.  Musculoskeletal: Positive for arthralgias, joint swelling and myalgias.  Neurological: Negative for weakness and numbness.  All other systems reviewed and are negative.    Allergies  Penicillins  Home Medications   Current Outpatient Rx  Name  Route  Sig  Dispense  Refill  . ibuprofen (ADVIL,MOTRIN) 600 MG tablet   Oral   Take 1 tablet (600 mg total) by  mouth every 6 (six) hours as needed.   30 tablet   0    BP 125/84  Pulse 98  Temp(Src) 98.2 F (36.8 C) (Oral)  Resp 18  Wt 139 lb 4.8 oz (63.186 kg)  SpO2 100%  Physical Exam  Nursing note and vitals reviewed. Constitutional: She is oriented to person, place, and time. She appears well-developed and well-nourished. No distress.  HENT:  Head: Normocephalic and atraumatic.  Eyes: Conjunctivae and EOM are normal. No scleral icterus.  Neck: Normal range of motion.  Cardiovascular: Normal rate, regular rhythm and intact distal pulses.   Distal radial pulse 2+ and left upper extremity. Capillary refill normal.  Pulmonary/Chest: Effort normal. No respiratory distress.  Musculoskeletal: Normal range of motion. She exhibits tenderness.       Left hand: She exhibits tenderness, bony tenderness and swelling. She exhibits normal range of motion, normal capillary refill and no deformity. Normal sensation noted. Normal strength noted.       Hands: 5/5 strength against resistance of FDP, FDS, and extensors of left index finger  Neurological: She is alert and oriented to person, place, and time.  No gross sensory deficits appreciated. Patient able to wiggle all fingers and make a tight fist with left hand.  Skin: Skin is warm and dry. No rash noted. She is not diaphoretic. No erythema. No pallor.  Psychiatric: She has a normal mood and affect. Her behavior is normal.    ED Course  Procedures (including critical care time) Labs Review Labs Reviewed -  No data to display Imaging Review Dg Finger Index Left  07/17/2013   CLINICAL DATA:  Trauma left index finger, smashed finger between cleaning cart and wall  EXAM: LEFT INDEX FINGER 2+V  COMPARISON:  None  FINDINGS: Diffuse soft tissue swelling left index finger.  Osseous mineralization normal.  Joint spaces preserved.  No acute fracture, dislocation or bone destruction.  IMPRESSION: No acute osseous abnormalities.   Electronically Signed   By:  Ulyses Southward M.D.   On: 07/17/2013 00:22    EKG Interpretation   None       MDM   1. Contusion, finger, initial encounter    Uncomplicated contusion of left index finger. Patient well and nontoxic appearing, hemodynamically stable, and afebrile. She is neurovascularly intact in physical exam. No bony deformities appreciated. X-ray negative for fracture dislocation of affected finger. Patient given ibuprofen for pain control and placed in static finger splint. She is stable for discharge with prescription for ibuprofen and instruction to ice the area. Return precautions discussed and patient agreeable to plan with no unaddressed concerns.    Antony Madura, PA-C 07/17/13 [phone]

## 2014-06-02 ENCOUNTER — Encounter (HOSPITAL_COMMUNITY): Payer: Self-pay | Admitting: Emergency Medicine

## 2015-01-30 DIAGNOSIS — N920 Excessive and frequent menstruation with regular cycle: Secondary | ICD-10-CM

## 2015-01-30 HISTORY — DX: Excessive and frequent menstruation with regular cycle: N92.0

## 2015-04-28 DIAGNOSIS — M545 Low back pain, unspecified: Secondary | ICD-10-CM

## 2015-04-28 DIAGNOSIS — D649 Anemia, unspecified: Secondary | ICD-10-CM

## 2015-04-28 DIAGNOSIS — G8929 Other chronic pain: Secondary | ICD-10-CM

## 2015-04-28 HISTORY — DX: Other chronic pain: G89.29

## 2015-04-28 HISTORY — DX: Anemia, unspecified: D64.9

## 2015-04-28 HISTORY — DX: Low back pain, unspecified: M54.50

## 2015-05-15 IMAGING — CR DG FINGER INDEX 2+V*L*
3 series · 3 of 3 positions shown · non-contrast
Comparison: None

CLINICAL DATA: Trauma left index finger, smashed finger between
cleaning cart and wall

EXAM:
LEFT INDEX FINGER 2+V

[x finger pa left]
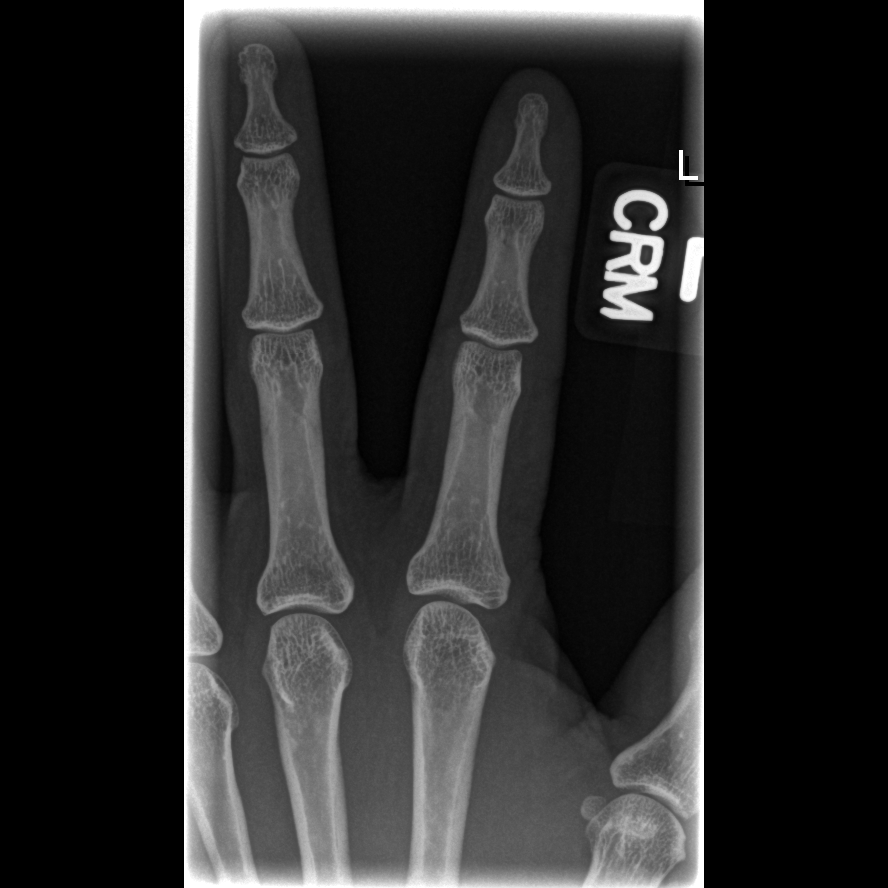

[x finger obl. left]
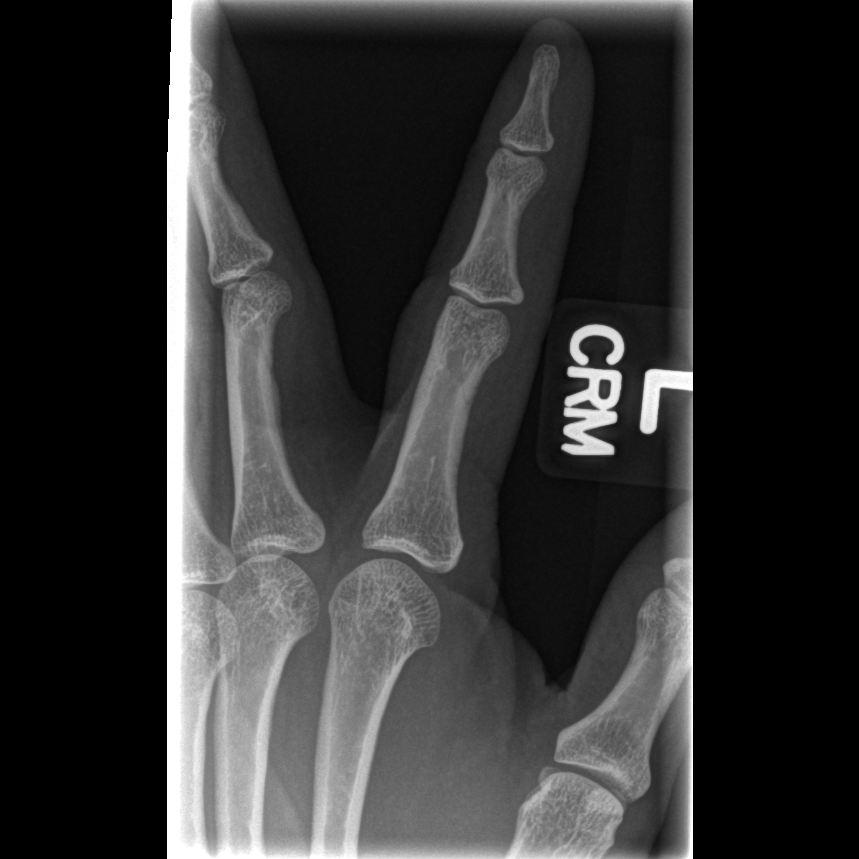

[x finger lateral left]
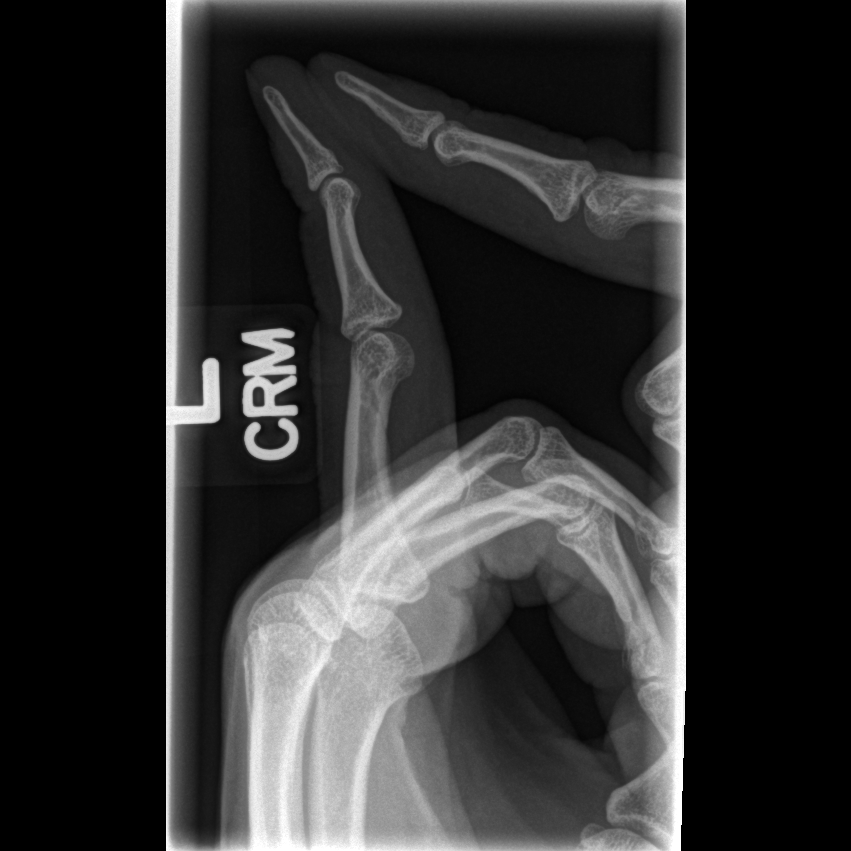

[3 of 3 positions shown; findings below may reference images not displayed]

FINDINGS: Diffuse soft tissue swelling left index finger.

Osseous mineralization normal.

Joint spaces preserved.

No acute fracture, dislocation or bone destruction.
IMPRESSION: No acute osseous abnormalities.

## 2015-05-28 ENCOUNTER — Other Ambulatory Visit (INDEPENDENT_AMBULATORY_CARE_PROVIDER_SITE_OTHER): Payer: Self-pay

## 2015-05-28 DIAGNOSIS — D649 Anemia, unspecified: Secondary | ICD-10-CM

## 2015-05-29 LAB — CBC WITH DIFFERENTIAL/PLATELET
BASOS ABS: 0 10*3/uL (ref 0.0–0.2)
Basos: 0 %
EOS (ABSOLUTE): 0.1 10*3/uL (ref 0.0–0.4)
EOS: 2 %
HEMOGLOBIN: 11.3 g/dL (ref 11.1–15.9)
Hematocrit: 37.7 % (ref 34.0–46.6)
IMMATURE GRANS (ABS): 0 10*3/uL (ref 0.0–0.1)
IMMATURE GRANULOCYTES: 1 %
LYMPHS ABS: 2.4 10*3/uL (ref 0.7–3.1)
LYMPHS: 43 %
MCH: 24.7 pg — AB (ref 26.6–33.0)
MCHC: 30 g/dL — ABNORMAL LOW (ref 31.5–35.7)
MCV: 82 fL (ref 79–97)
MONOCYTES: 8 %
Monocytes Absolute: 0.4 10*3/uL (ref 0.1–0.9)
NEUTROS ABS: 2.6 10*3/uL (ref 1.4–7.0)
NEUTROS PCT: 46 %
Platelets: 310 10*3/uL (ref 150–379)
RBC: 4.58 x10E6/uL (ref 3.77–5.28)
WBC: 5.6 10*3/uL (ref 3.4–10.8)

## 2015-06-17 NOTE — Progress Notes (Signed)
Quick Note: ° °06/17/15 left voicemail to return call °______ °

## 2015-06-17 NOTE — Progress Notes (Signed)
Quick Note:  06/17/15 patient returned call and was given lab results. ______

## 2015-07-28 ENCOUNTER — Encounter: Payer: Self-pay | Admitting: Internal Medicine

## 2015-07-28 ENCOUNTER — Ambulatory Visit (INDEPENDENT_AMBULATORY_CARE_PROVIDER_SITE_OTHER): Payer: Self-pay | Admitting: Internal Medicine

## 2015-07-28 VITALS — BP 110/70 | HR 73 | Ht 61.0 in | Wt 143.0 lb

## 2015-07-28 DIAGNOSIS — D649 Anemia, unspecified: Secondary | ICD-10-CM | POA: Insufficient documentation

## 2015-07-28 DIAGNOSIS — D500 Iron deficiency anemia secondary to blood loss (chronic): Secondary | ICD-10-CM

## 2015-07-28 DIAGNOSIS — M62830 Muscle spasm of back: Secondary | ICD-10-CM

## 2015-07-28 DIAGNOSIS — K029 Dental caries, unspecified: Secondary | ICD-10-CM

## 2015-07-28 NOTE — Progress Notes (Signed)
   Subjective:    Patient ID: Cynthia Rodriguez, female    DOB: [birth year], 39 y.o.   MRN: [id number]  HPI  1.  Anemia:  Hemoglobin 05/28/15 was up 3 g to 11.3 from 8.1 04/28/15.  Has stopped craving/chewing ice.  Has more energy.  Still having heavy periods (started becoming heavier in June or July).  Periods are regular, lasting 7 days.  Often with 3 days of heavy bleeding with clots in middle three days, then tapers.  Now taking Ferrous Sulfate, 325 mg daily, down from twice daily previously.  No side effects from iron supplementation. Has used ibuprofen for the cramping and has helped.  Also, seems to have slowed blood loss. Last pap/pelvic was in July at [facility].  Had IUD  Placed 5 years ago, and good for 10 years. No hot flashes or night sweats. No vaginal discharge.  2.  Left shoulder pain:  Started 3 months ago.  No history of injury she can recall.  May have overused, but is not sure.  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  Notes the pain after a long day at work.  Works 5 days weekly.   Can also have the pain in the morning, depending on how she sleeps.  Only sleeps on side, not back or abdomen. Actually, points to paraspinous musculature medial to left scapula.  States the pain can go higher up her spine or down at times.  No radiation down arm.  No numbness, tingling, or weakness down arm.     Review of Systems     Objective:   Physical Exam NAD HEENT:  Palpebral conjunctivae with good deep pink color, PERRL, EOMI.. Neck:  Supple, no adenopathy Chest:  CTA CV:  RRR without murmur or rub Abd:  S, NT, No HSM or mass, no enlarged uterine fundus over top of suprapubic area. MS:  Full ROM of left shoulder and neck.  NT over spinous processes, C spine.  Tender over paraspinous musculature medial to scapula on left with palpable mild muscle spasm. Neuro:  Motor 5/5 and DTRs 2+/4 bilateral UE       Assessment &  Plan:  1.  Anemia from heavy periods:  Send for records for type of IUD with PHD.  ?  Copper?  Encouraged pt. To utilize Ibuprofen during periods to limit blood loss as well. Sounds like she has noted some improvement with the ibuprofen she has utilized during periods. Recheck CBC--had elevated platelets also with CBC that were dropping as anemia resolved when last checked.  387 down to 310 with near resolution of anemia.  2.  Left upper back muscle spasm:  Went over warm packing and twice daily stretches of neck.  3.  Dental concerns:  Would like to get teeth cleaned.  Given information on [employer] for low cost cleaning and referral to Dental Clinic.

## 2015-07-28 NOTE — Patient Instructions (Addendum)
Please sign a release of information when you leave today, so we can find out more about your last pap/pelvic exam and IUD information. Take the Ibuprofen 200 mg 4 tabs twice daily with food during your period, especially when you tend to have more bleeding--this can also help slow the bleeding, not just control the cramps.  Can google "advance directives, [place]"  And bring up form from [place]. Print and fill out Or can go to "5 wishes"  Which is also in Spanish and fill out--this costs $5--perhaps easier to use. Designate a CytogeneticistMedical Power of Attorney to speak for you if you are unable to speak for yourself when ill or injured  Do neck stretches twice daily after warm packing neck and upper back for 20 minutes (can do warm bath soak to warm or get heating pad) Head to shoulder for 10 counts both sides, side of chin to chest both sides and chin to chest straight forward. Do each stretch for 10 counts  May take your ibuprofen 3-4 tabs every 6 hours for upper back pain as well--always take with food

## 2015-07-29 LAB — CBC WITH DIFFERENTIAL/PLATELET
BASOS: 0 %
Basophils Absolute: 0 10*3/uL (ref 0.0–0.2)
EOS (ABSOLUTE): 0.1 10*3/uL (ref 0.0–0.4)
Eos: 1 %
Hematocrit: 41.4 % (ref 34.0–46.6)
Hemoglobin: 12.8 g/dL (ref 11.1–15.9)
IMMATURE GRANS (ABS): 0 10*3/uL (ref 0.0–0.1)
Immature Granulocytes: 0 %
Lymphocytes Absolute: 2.4 10*3/uL (ref 0.7–3.1)
Lymphs: 33 %
MCH: 29.3 pg (ref 26.6–33.0)
MCHC: 30.9 g/dL — ABNORMAL LOW (ref 31.5–35.7)
MCV: 95 fL (ref 79–97)
MONOS ABS: 0.5 10*3/uL (ref 0.1–0.9)
Monocytes: 6 %
NEUTROS ABS: 4.2 10*3/uL (ref 1.4–7.0)
Neutrophils: 60 %
PLATELETS: 304 10*3/uL (ref 150–379)
RBC: 4.37 x10E6/uL (ref 3.77–5.28)
RDW: 16.6 % — AB (ref 12.3–15.4)
WBC: 7.2 10*3/uL (ref 3.4–10.8)

## 2015-07-29 NOTE — Progress Notes (Signed)
Quick Note:    Patient informed.  ______

## 2016-04-08 ENCOUNTER — Ambulatory Visit (INDEPENDENT_AMBULATORY_CARE_PROVIDER_SITE_OTHER): Payer: Self-pay | Admitting: Internal Medicine

## 2016-04-08 ENCOUNTER — Encounter: Payer: Self-pay | Admitting: Internal Medicine

## 2016-04-08 VITALS — BP 116/70 | HR 74 | Resp 16 | Ht 60.0 in | Wt 148.0 lb

## 2016-04-08 DIAGNOSIS — J309 Allergic rhinitis, unspecified: Secondary | ICD-10-CM

## 2016-04-08 DIAGNOSIS — E049 Nontoxic goiter, unspecified: Secondary | ICD-10-CM

## 2016-04-08 DIAGNOSIS — D500 Iron deficiency anemia secondary to blood loss (chronic): Secondary | ICD-10-CM

## 2016-04-08 DIAGNOSIS — H547 Unspecified visual loss: Secondary | ICD-10-CM

## 2016-04-08 DIAGNOSIS — E010 Iodine-deficiency related diffuse (endemic) goiter: Secondary | ICD-10-CM

## 2016-04-08 DIAGNOSIS — Z23 Encounter for immunization: Secondary | ICD-10-CM

## 2016-04-08 DIAGNOSIS — Z0000 Encounter for general adult medical examination without abnormal findings: Secondary | ICD-10-CM

## 2016-04-08 DIAGNOSIS — Z13220 Encounter for screening for lipoid disorders: Secondary | ICD-10-CM

## 2016-04-08 MED ORDER — FEXOFENADINE HCL 180 MG PO TABS: 180.0000 mg | ORAL_TABLET | Freq: Every day | ORAL | Status: AC

## 2016-04-08 NOTE — Progress Notes (Signed)
Subjective:    Patient ID: Cynthia Rodriguez, female    DOB: [birth year], 40 y.o.   MRN: [id number]  HPI   Here for CPE.  1.  Last Pap:  [facility], normal.  Had one abnormal pap in past that resolved with treatment of BV.  Has  Paragard Copper IUD in place.  Needs to be removed by 10/10/2020.  2.  Mammogram:  Had mammogram and ultrasound in 2011 for palpated left breast mass, but no abnormality on either evaluation.  3.  SBE: Yes.  No findings.  4.  Cholesterol:  Last checked 2011.  Was at goal.  5.  Osteoprevention;  Occasional milk intake.  Cheese twice weekly.  No yogurt.  Does like Sardines and would be willing to eat those regularly.  No family history of gout.  6.  Colonoscopy:  Never  7.  Guaiac Cards:  Never  Current Meds  Medication Sig  . ferrous sulfate 325 (65 FE) MG tablet Take 325 mg by mouth daily with breakfast.  . Garlic 1000 MG CAPS Take 1 capsule by mouth daily.  Marland Kitchen. ibuprofen (ADVIL,MOTRIN) 200 MG tablet Take 200 mg by mouth every 6 (six) hours as needed for cramping (menstrual bleeding). 3-4 tabs by mouth every 6 hours as needed.  . Multiple Vitamin (MULTIVITAMINS PO) Take 1 tablet by mouth daily.  Marland Kitchen. PARAGARD INTRAUTERINE COPPER IU by Intrauterine route.  . Probiotic Product (PROBIOTIC COMPLEX ACIDOPHILUS PO) Take 1 capsule by mouth daily.   Allergies  Allergen Reactions  . Penicillins Swelling and Rash    REACTION: swelling of legs, hives   Past Medical History:  Diagnosis Date  . Anemia 04/28/2015   From heavy periods--iron deficiency  . Chronic low back pain 04/28/2015   Treated successfully with acupuncture  . Heavy periods 01/2015   Has IUD--placed in 2011    Past Surgical History:  Procedure Laterality Date  . CESAREAN SECTION  2001   LOw transverse   Family History  Problem Relation Age of Onset  . Alcohol abuse Mother   . Cirrhosis Mother   . Hypertension Mother   . Kidney disease Father   . Diabetes Father   . Hypertension  Father   . Anemia Father   . Hypertension Sister   . Obesity Sister     Social History   Social History  . Marital status: Single    Spouse name: Long term boyfriend  . Number of children: 3  . Years of education: 9   Occupational History  . Insurance account managerLaundry worker     Since 1999   Social History Main Topics  . Smoking status: Never Smoker  . Smokeless tobacco: Never Used  . Alcohol use Yes     Comment: once monthly--rare  . Drug use: No  . Sexual activity: Yes    Birth control/ protection: IUD     Comment: ?Copper IUD   Other Topics Concern  . Not on file   Social History Narrative   Originally from [place]   [place]ame to [place] In [place]   Lives with sister, sister's 3 children as well as her own 3 children.   Does have a long term boyfriend who does not live with her.   Father of children remarried.  Difficulties with his new wife.  He is still somewhat involved with his children.  Does not financially support.          Review of Systems  Constitutional: Negative for activity change,  appetite change, fatigue and unexpected weight change.  HENT: Positive for sneezing and sore throat. Negative for dental problem, ear pain, hearing loss and rhinorrhea.        Has been to dentist this year.   Eyes: Positive for visual disturbance.       Vision not as clear with current prescriptive lenses  Respiratory: Negative for cough and shortness of breath.   Cardiovascular: Negative for chest pain, palpitations and leg swelling.  Gastrointestinal: Negative for abdominal pain, blood in stool, constipation, diarrhea, nausea and vomiting.       No melena.  Has had lighter stools recently, but no abdominal pain with this  Genitourinary: Negative for dysuria, frequency, hematuria, urgency and vaginal discharge.       Did have vaginal discharge 2 months ago.  No odor.  Resolved with one night yeast infection cream  Musculoskeletal: Negative for arthralgias.       Pain at times medial to left  shoulder blade.  Skin: Negative.   Neurological: Negative for dizziness, weakness and numbness.  Hematological: Negative for adenopathy. Does not bruise/bleed easily.  Psychiatric/Behavioral: Negative for dysphoric mood, sleep disturbance and suicidal ideas. The patient is not nervous/anxious.        Objective:   Physical Exam  Constitutional: She is oriented to person, place, and time. She appears well-developed and well-nourished.  HENT:  Head: Normocephalic and atraumatic.  Right Ear: Hearing, tympanic membrane, external ear and ear canal normal.  Left Ear: Hearing, tympanic membrane, external ear and ear canal normal.  Nose: Mucosal edema present.  Mouth/Throat: Uvula is midline, oropharynx is clear and moist and mucous membranes are normal.  Eyes: Conjunctivae and EOM are normal. Pupils are equal, round, and reactive to light.  Discs sharp bilaterally with normal RR OU  Neck: Normal range of motion. Neck supple. Thyromegaly present.  Thyroid mildly enlarged and smooth no bruit  Cardiovascular: Normal rate, regular rhythm, S1 normal, S2 normal, normal heart sounds and intact distal pulses.  Exam reveals no S3 and no S4.   No murmur heard. No carotid bruits.  Carotid, radial, femoral, DP and PT pulses normal and equal.   Pulmonary/Chest: Effort normal and breath sounds normal. Right breast exhibits no inverted nipple, no mass, no nipple discharge, no skin change and no tenderness. Left breast exhibits no inverted nipple, no mass, no nipple discharge, no skin change and no tenderness.  Abdominal: Soft. Bowel sounds are normal. She exhibits no mass. There is no hepatosplenomegaly. There is no tenderness. No hernia.  Genitourinary: Vagina normal and uterus normal. Cervix exhibits no motion tenderness. Right adnexum displays no mass and no tenderness. Left adnexum displays no mass and no tenderness. No vaginal discharge found.  Musculoskeletal: Normal range of motion.  Lymphadenopathy:        Head (right side): No submental and no submandibular adenopathy present.       Head (left side): No submental and no submandibular adenopathy present.    She has no cervical adenopathy.    She has no axillary adenopathy.       Right: No inguinal and no supraclavicular adenopathy present.       Left: No inguinal and no supraclavicular adenopathy present.  Neurological: She is alert and oriented to person, place, and time. She has normal strength and normal reflexes. No cranial nerve deficit or sensory deficit. Coordination and gait normal.  Skin: Skin is warm and dry. No lesion and no rash noted.  Psychiatric: She has a normal mood and  affect. Her behavior is normal. Judgment and thought content normal. Cognition and memory are normal.          Assessment & Plan:  CPE with pelvic, no pap Tdap today, [facility] CMP, FLP  2.  Hx anemia:  CBC  3.  Mild thyromegaly:  TSH.  4.  Decreased visual acuity:  Optometry referral.  5.  Allergic Rhinitis:  Fexofenadine 180 mg daily as needed.

## 2016-04-08 NOTE — Patient Instructions (Signed)
[facility] 30 from 1-4 p.m.

## 2016-04-09 LAB — CBC WITH DIFFERENTIAL/PLATELET
BASOS ABS: 0 10*3/uL (ref 0.0–0.2)
Basos: 0 %
EOS (ABSOLUTE): 0.1 10*3/uL (ref 0.0–0.4)
EOS: 2 %
HEMATOCRIT: 36.7 % (ref 34.0–46.6)
HEMOGLOBIN: 11.8 g/dL (ref 11.1–15.9)
Immature Grans (Abs): 0 10*3/uL (ref 0.0–0.1)
Immature Granulocytes: 0 %
LYMPHS ABS: 2.7 10*3/uL (ref 0.7–3.1)
Lymphs: 39 %
MCH: 27.2 pg (ref 26.6–33.0)
MCHC: 32.2 g/dL (ref 31.5–35.7)
MCV: 85 fL (ref 79–97)
MONOCYTES: 7 %
MONOS ABS: 0.5 10*3/uL (ref 0.1–0.9)
NEUTROS ABS: 3.6 10*3/uL (ref 1.4–7.0)
Neutrophils: 52 %
Platelets: 297 10*3/uL (ref 150–379)
RBC: 4.34 x10E6/uL (ref 3.77–5.28)
RDW: 13.7 % (ref 12.3–15.4)
WBC: 6.9 10*3/uL (ref 3.4–10.8)

## 2016-04-09 LAB — COMPREHENSIVE METABOLIC PANEL
ALBUMIN: 4.4 g/dL (ref 3.5–5.5)
ALK PHOS: 46 IU/L (ref 39–117)
ALT: 19 IU/L (ref 0–32)
AST: 23 IU/L (ref 0–40)
Albumin/Globulin Ratio: 1.6 (ref 1.2–2.2)
BILIRUBIN TOTAL: 0.3 mg/dL (ref 0.0–1.2)
BUN / CREAT RATIO: 27 — AB (ref 9–23)
BUN: 13 mg/dL (ref 6–20)
CHLORIDE: 98 mmol/L (ref 96–106)
CO2: 24 mmol/L (ref 18–29)
Calcium: 9.2 mg/dL (ref 8.7–10.2)
Creatinine, Ser: 0.48 mg/dL — ABNORMAL LOW (ref 0.57–1.00)
GFR calc Af Amer: 143 mL/min/{1.73_m2} (ref >59)
GFR calc non Af Amer: 124 mL/min/{1.73_m2} (ref >59)
GLOBULIN, TOTAL: 2.8 g/dL (ref 1.5–4.5)
Glucose: 82 mg/dL (ref 65–99)
POTASSIUM: 4.3 mmol/L (ref 3.5–5.2)
SODIUM: 137 mmol/L (ref 134–144)
Total Protein: 7.2 g/dL (ref 6.0–8.5)

## 2016-04-09 LAB — LIPID PANEL W/O CHOL/HDL RATIO
CHOLESTEROL TOTAL: 206 mg/dL — AB (ref 100–199)
HDL: 66 mg/dL (ref >39)
LDL Calculated: 119 mg/dL — ABNORMAL HIGH (ref 0–99)
Triglycerides: 105 mg/dL (ref 0–149)
VLDL Cholesterol Cal: 21 mg/dL (ref 5–40)

## 2016-04-09 LAB — TSH: TSH: 1.77 u[IU]/mL (ref 0.450–4.500)

## 2021-01-28 ENCOUNTER — Encounter (HOSPITAL_COMMUNITY): Payer: Self-pay

## 2021-01-28 ENCOUNTER — Ambulatory Visit (INDEPENDENT_AMBULATORY_CARE_PROVIDER_SITE_OTHER): Payer: Worker's Compensation

## 2021-01-28 ENCOUNTER — Other Ambulatory Visit: Payer: Self-pay

## 2021-01-28 ENCOUNTER — Ambulatory Visit (HOSPITAL_COMMUNITY)
Admission: EM | Admit: 2021-01-28 | Discharge: 2021-01-28 | Disposition: A | Discharge status: Home / Self Care | Payer: Worker's Compensation | Attending: Urgent Care | Admitting: Urgent Care

## 2021-01-28 DIAGNOSIS — S93401A Sprain of unspecified ligament of right ankle, initial encounter: Secondary | ICD-10-CM

## 2021-01-28 DIAGNOSIS — M25571 Pain in right ankle and joints of right foot: Secondary | ICD-10-CM

## 2021-01-28 DIAGNOSIS — M25471 Effusion, right ankle: Secondary | ICD-10-CM | POA: Diagnosis not present

## 2021-01-28 DIAGNOSIS — W19XXXA Unspecified fall, initial encounter: Secondary | ICD-10-CM

## 2021-01-28 MED ORDER — HYDROCODONE-ACETAMINOPHEN 5-325 MG PO TABS
1.0000 | ORAL_TABLET | Freq: Once | ORAL | Status: AC
  Administered 2021-01-28: 1 via ORAL

## 2021-01-28 MED ORDER — HYDROCODONE-ACETAMINOPHEN 5-325 MG PO TABS
ORAL_TABLET | ORAL | Status: AC
  Filled 2021-01-28: qty 1

## 2021-01-28 MED ORDER — NAPROXEN 500 MG PO TABS: 500.0000 mg | ORAL_TABLET | Freq: Two times a day (BID) | ORAL | 0 refills | Status: AC

## 2021-01-28 NOTE — ED Triage Notes (Signed)
Pt presents with right foot injury that happened today.  States she fell on the last step in a set of stairs at work.

## 2021-01-28 NOTE — ED Provider Notes (Signed)
[facility]   MRN: [id number] DOB: [birth year]  Subjective:   Cynthia Rodriguez is a 45 y.o. female presenting for acute onset of severe right ankle pain with swelling.  Patient was at work, coming down some steps and on the last step, she lost her footing and twisted her ankle. Took ibuprofen for her symptoms.  Denies head injury, head trauma, loss of consciousness.  No other pain in any other joints.  No current facility-administered medications for this encounter.  Current Outpatient Medications:    ferrous sulfate 325 (65 FE) MG tablet, Take 325 mg by mouth daily with breakfast., Disp: , Rfl:    fexofenadine (ALLEGRA) 180 MG tablet, Take 1 tablet (180 mg total) by mouth daily., Disp: , Rfl:    Garlic 1000 MG CAPS, Take 1 capsule by mouth daily., Disp: , Rfl:    ibuprofen (ADVIL,MOTRIN) 200 MG tablet, Take 200 mg by mouth every 6 (six) hours as needed for cramping (menstrual bleeding). 3-4 tabs by mouth every 6 hours as needed., Disp: , Rfl:    Multiple Vitamin (MULTIVITAMINS PO), Take 1 tablet by mouth daily., Disp: , Rfl:    Probiotic Product (PROBIOTIC COMPLEX ACIDOPHILUS PO), Take 1 capsule by mouth daily., Disp: , Rfl:    Allergies  Allergen Reactions   Penicillins Swelling and Rash    REACTION: swelling of legs, hives    Past Medical History:  Diagnosis Date   Anemia 04/28/2015   From heavy periods--iron deficiency   Chronic low back pain 04/28/2015   Treated successfully with acupuncture   Heavy periods 01/2015   Has IUD--placed in 2011     Past Surgical History:  Procedure Laterality Date   CESAREAN SECTION  2001   LOw transverse    Family History  Problem Relation Age of Onset   Alcohol abuse Mother    Cirrhosis Mother    Hypertension Mother    Kidney disease Father    Diabetes Father    Hypertension Father    Anemia Father    Hypertension Sister    Obesity Sister     Social History   Tobacco Use   Smoking status: Never   Smokeless  tobacco: Never  Substance Use Topics   Alcohol use: Yes    Comment: once monthly--rare   Drug use: No    ROS   Objective:   Vitals: BP 137/84 (BP Location: Right Arm)   Pulse 98   Temp 99 F (37.2 C) (Oral)   Resp 18   LMP 01/10/2021 (Exact Date)   SpO2 100%   Physical Exam Constitutional:      General: She is not in acute distress.    Appearance: Normal appearance. She is well-developed. She is not ill-appearing, toxic-appearing or diaphoretic.  HENT:     Head: Normocephalic and atraumatic.     Nose: Nose normal.     Mouth/Throat:     Mouth: Mucous membranes are moist.     Pharynx: Oropharynx is clear.  Eyes:     General: No scleral icterus.       Right eye: No discharge.        Left eye: No discharge.     Extraocular Movements: Extraocular movements intact.     Conjunctiva/sclera: Conjunctivae normal.     Pupils: Pupils are equal, round, and reactive to light.  Cardiovascular:     Rate and Rhythm: Normal rate.  Pulmonary:     Effort: Pulmonary effort is normal.  Musculoskeletal:  Right ankle: Swelling present. No deformity, ecchymosis or lacerations. Tenderness present over the lateral malleolus, ATF ligament and AITF ligament. No medial malleolus, CF ligament, posterior TF ligament, base of 5th metatarsal or proximal fibula tenderness. Decreased range of motion.     Right Achilles Tendon: No tenderness or defects. Thompson's test negative.  Skin:    General: Skin is warm and dry.  Neurological:     General: No focal deficit present.     Mental Status: She is alert and oriented to person, place, and time.     Motor: No weakness.     Coordination: Coordination normal.     Gait: Gait normal.     Deep Tendon Reflexes: Reflexes normal.  Psychiatric:        Mood and Affect: Mood normal.        Behavior: Behavior normal.        Thought Content: Thought content normal.        Judgment: Judgment normal.    DG Ankle Complete Right  Result Date:  01/28/2021 CLINICAL DATA:  45 year old female with right ankle pain and swelling EXAM: RIGHT ANKLE - COMPLETE 3+ VIEW COMPARISON:  None. FINDINGS: There is no acute fracture or dislocation. The bones are well mineralized. No arthritic changes. There is soft tissue swelling over the lateral ankle. No radiopaque foreign object or soft tissue gas. IMPRESSION: No acute fracture or dislocation. Electronically Signed   By: Elgie Collard M.D.   On: 01/28/2021 15:59    Right ankle wrapped using 3" Ace wrap in figure-8 method.    Assessment and Plan :   PDMP not reviewed this encounter.  1. Pain and swelling of ankle, right   2. Sprain of right ankle, unspecified ligament, initial encounter   3. Fall, initial encounter     Will manage for ankle sprain with rice method, NSAID. Counseled patient on potential for adverse effects with medications prescribed/recommended today, ER and return-to-clinic precautions discussed, patient verbalized understanding.    Wallis Bamberg, [place] 01/28/21 [phone]

## 2021-02-17 ENCOUNTER — Ambulatory Visit: Payer: Self-pay | Admitting: Orthopaedic Surgery

## 2022-11-26 IMAGING — DX DG ANKLE COMPLETE 3+V*R*
3 series · 3 of 3 positions shown · non-contrast
Comparison: None.

CLINICAL DATA: 45-year-old female with right ankle pain and
swelling

EXAM:
RIGHT ANKLE - COMPLETE 3+ VIEW

[ankle ap]
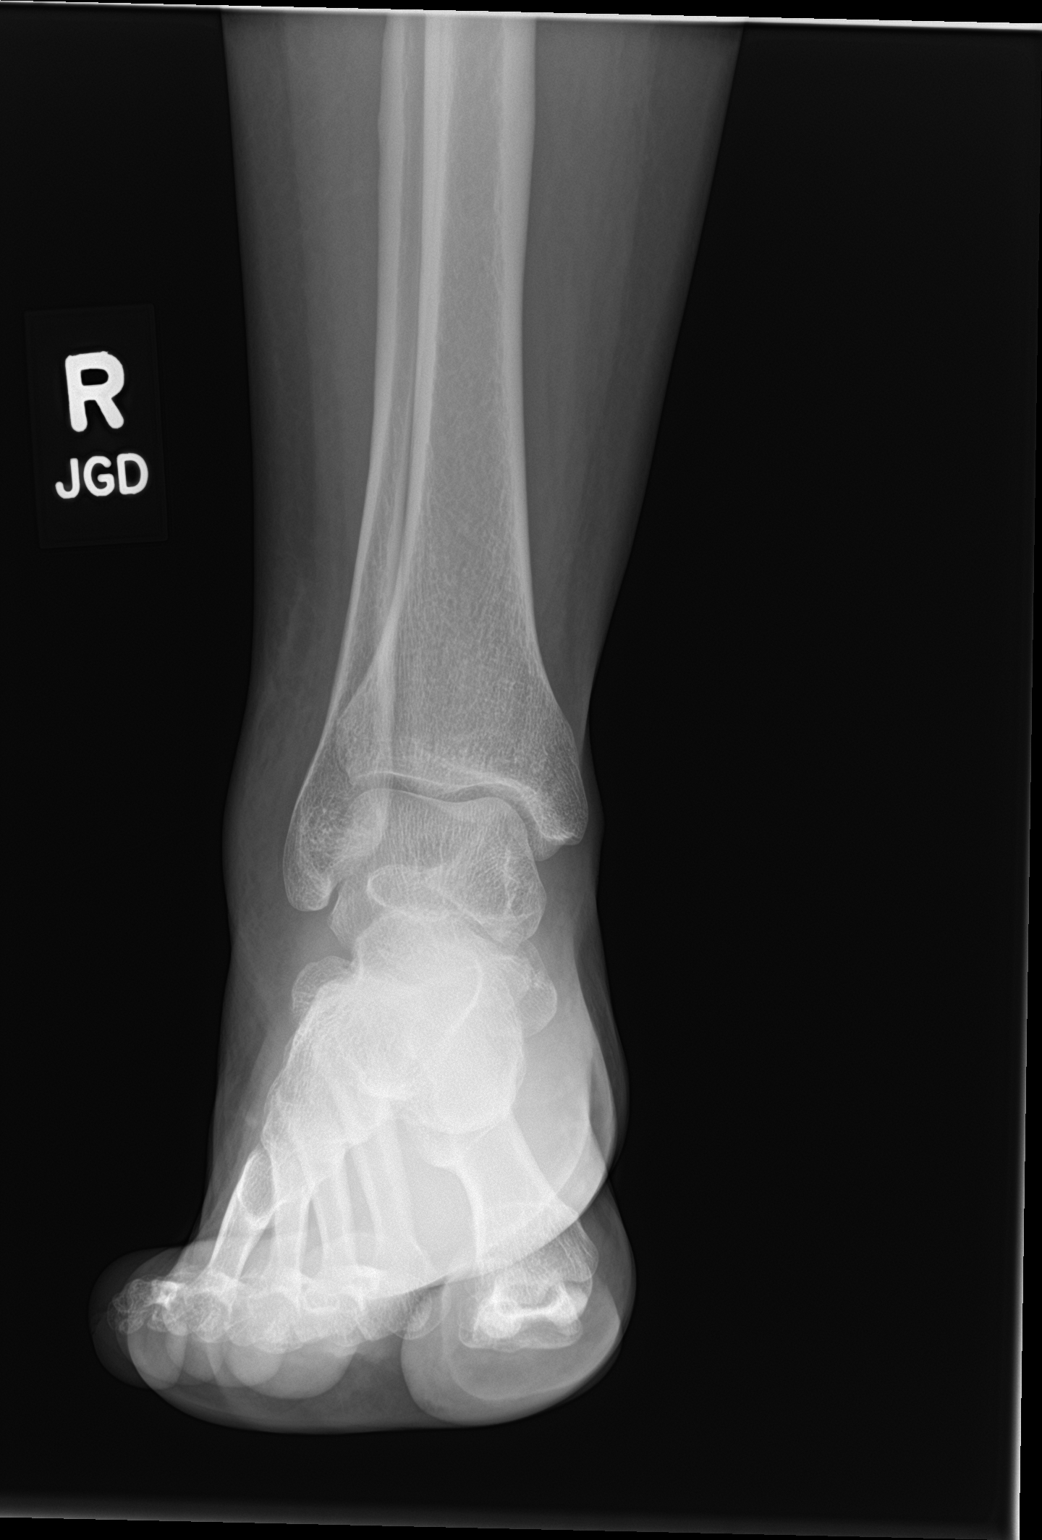

[ankle obl]
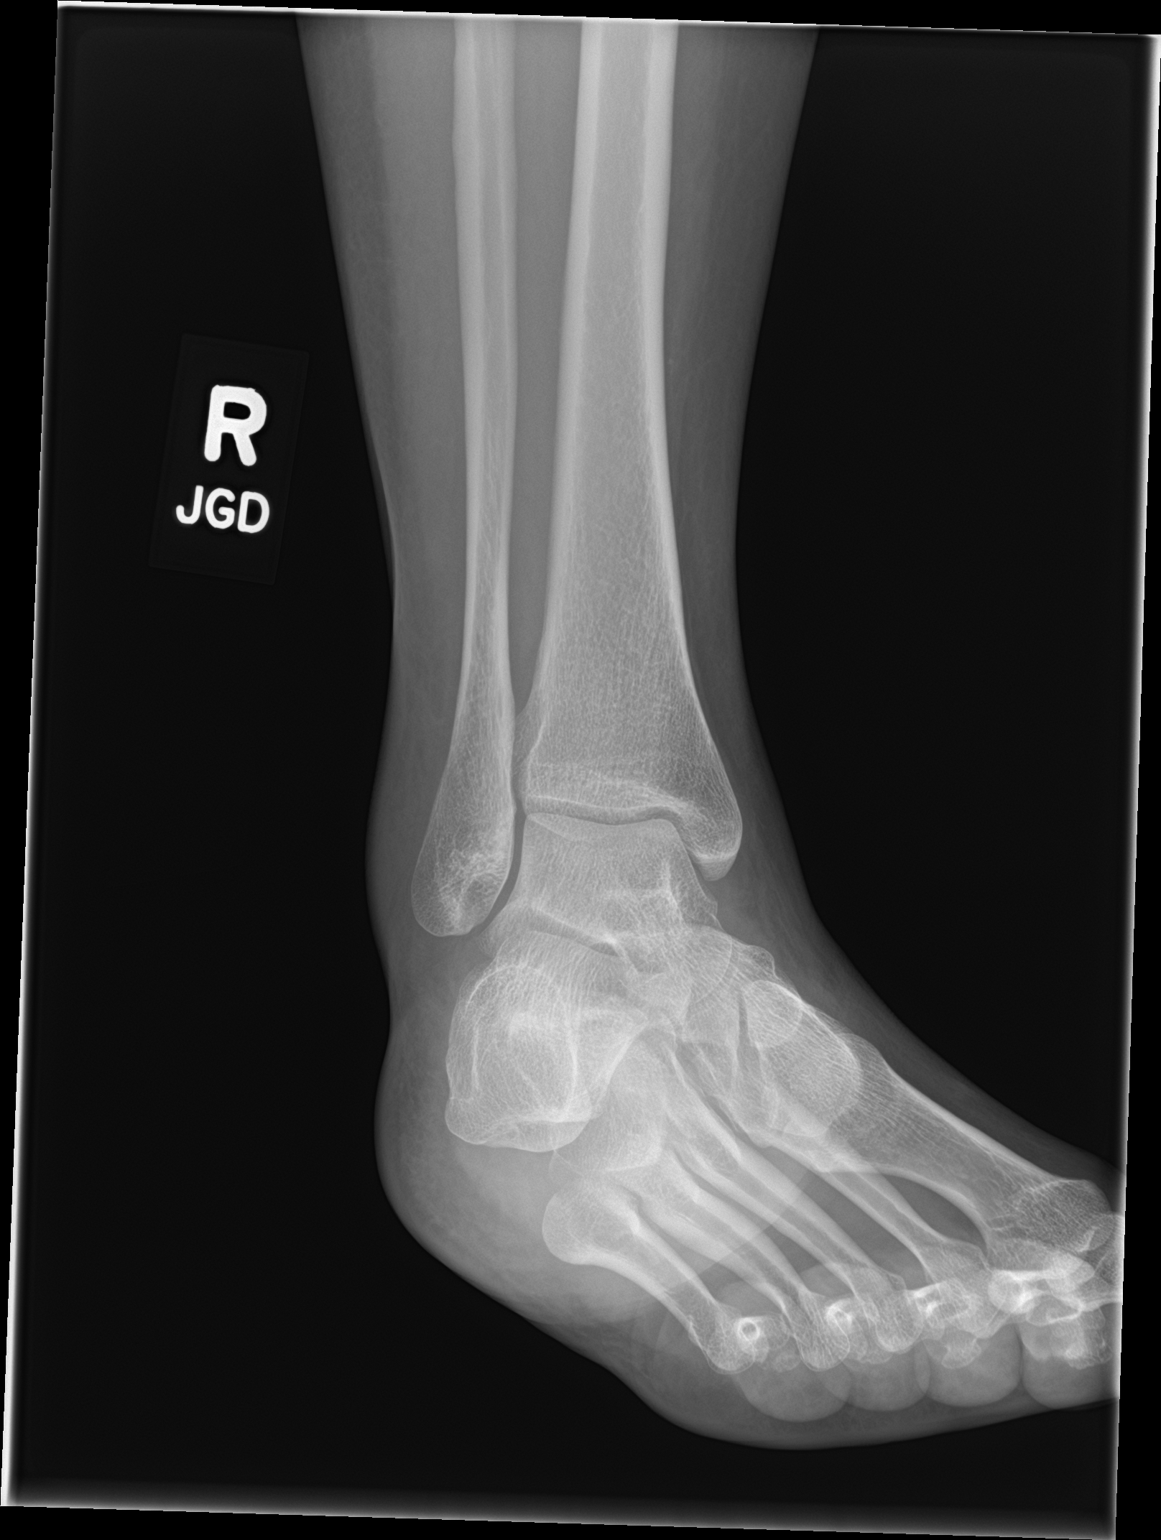

[ankle lat]
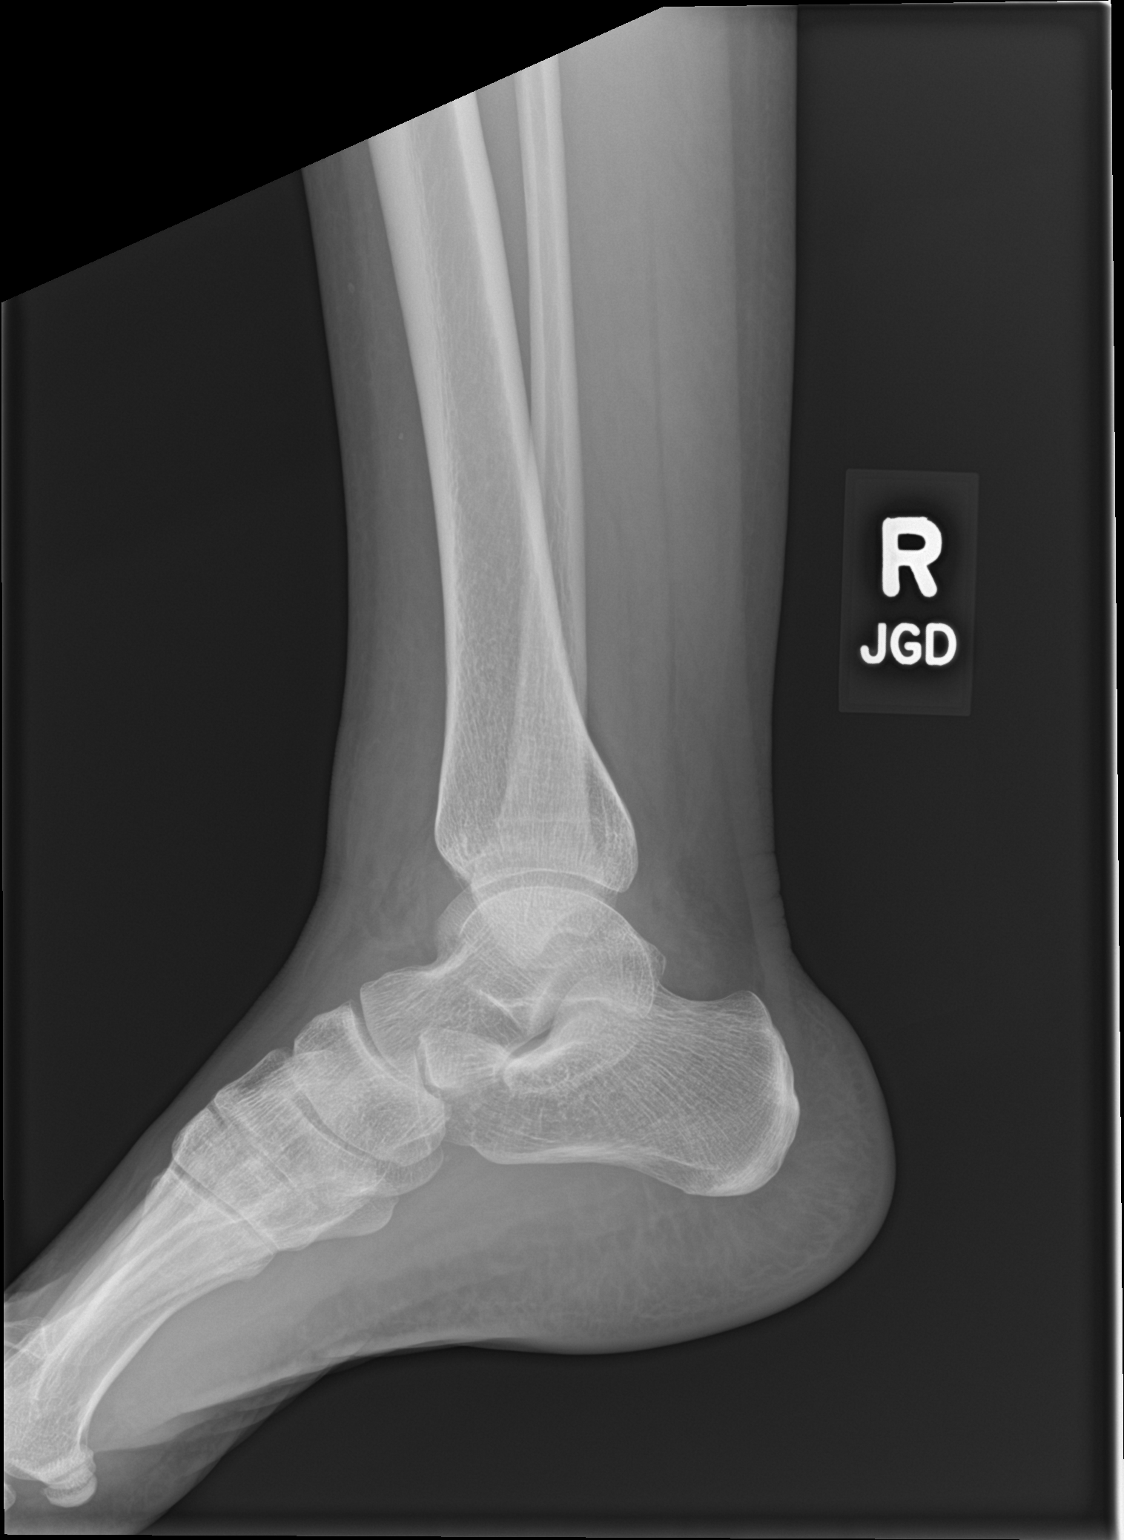

[3 of 3 positions shown; findings below may reference images not displayed]

FINDINGS: There is no acute fracture or dislocation. The bones are well
mineralized. No arthritic changes. There is soft tissue swelling
over the lateral ankle. No radiopaque foreign object or soft tissue
gas.
IMPRESSION: No acute fracture or dislocation.

## 2024-02-05 ENCOUNTER — Ambulatory Visit: Payer: Self-pay | Admitting: Internal Medicine
